# Patient Record
Sex: Female | Born: 1990 | Race: White | Hispanic: No | Marital: Single
Health system: Southern US, Community
[De-identification: ages and names within clinical notes are randomized; demographics above are authoritative.]

---

## 2012-02-24 ENCOUNTER — Emergency Department: Payer: Self-pay | Admitting: Otolaryngology

## 2012-05-19 ENCOUNTER — Ambulatory Visit: Payer: Self-pay | Admitting: Family Medicine

## 2013-09-18 ENCOUNTER — Ambulatory Visit: Payer: Self-pay | Admitting: Family Medicine

## 2014-01-17 IMAGING — CR DG ANKLE COMPLETE 3+V*L*
1 series · 5 of 5 positions shown · non-contrast
Comparison: none

REASON FOR EXAM: pain
COMMENTS:

PROCEDURE:     DXR - DXR ANKLE LEFT COMPLETE  - May 19, 2012  [DATE]
RESULT:     Comparison: None.

[Series 1: x ankle ap left · 0.14mm/px · 5 of 5 slices shown]
[im 1/5]
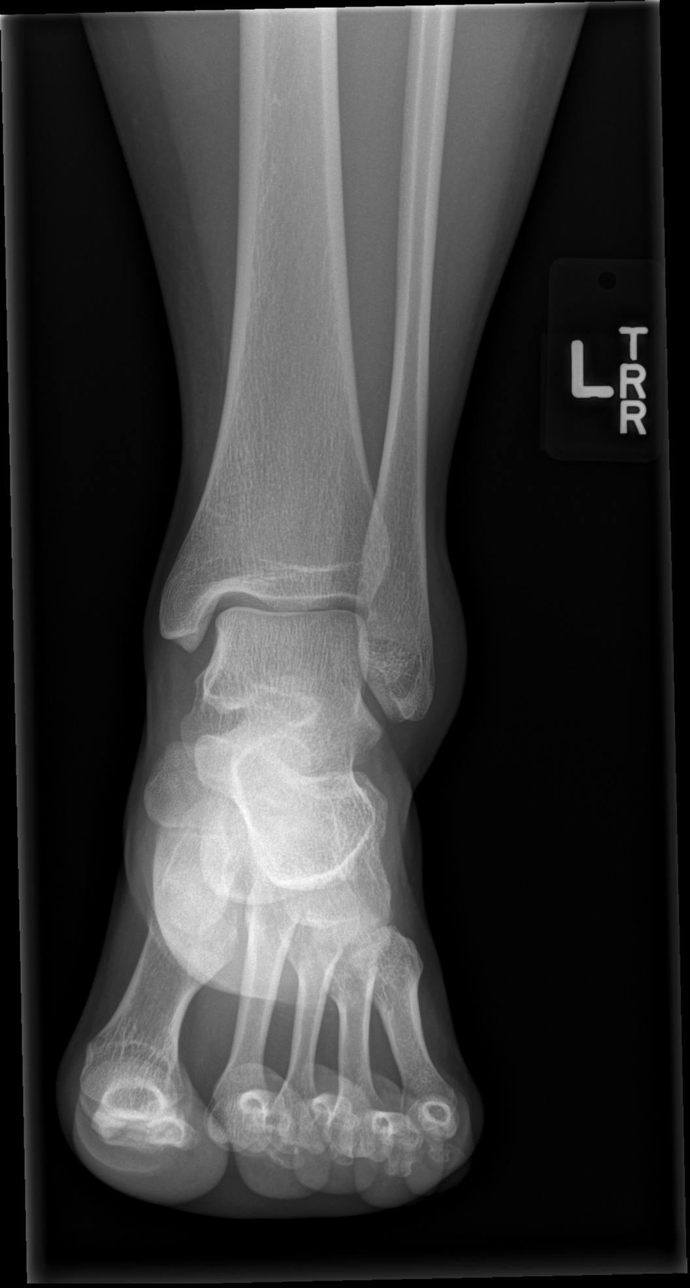
[im 2/5]
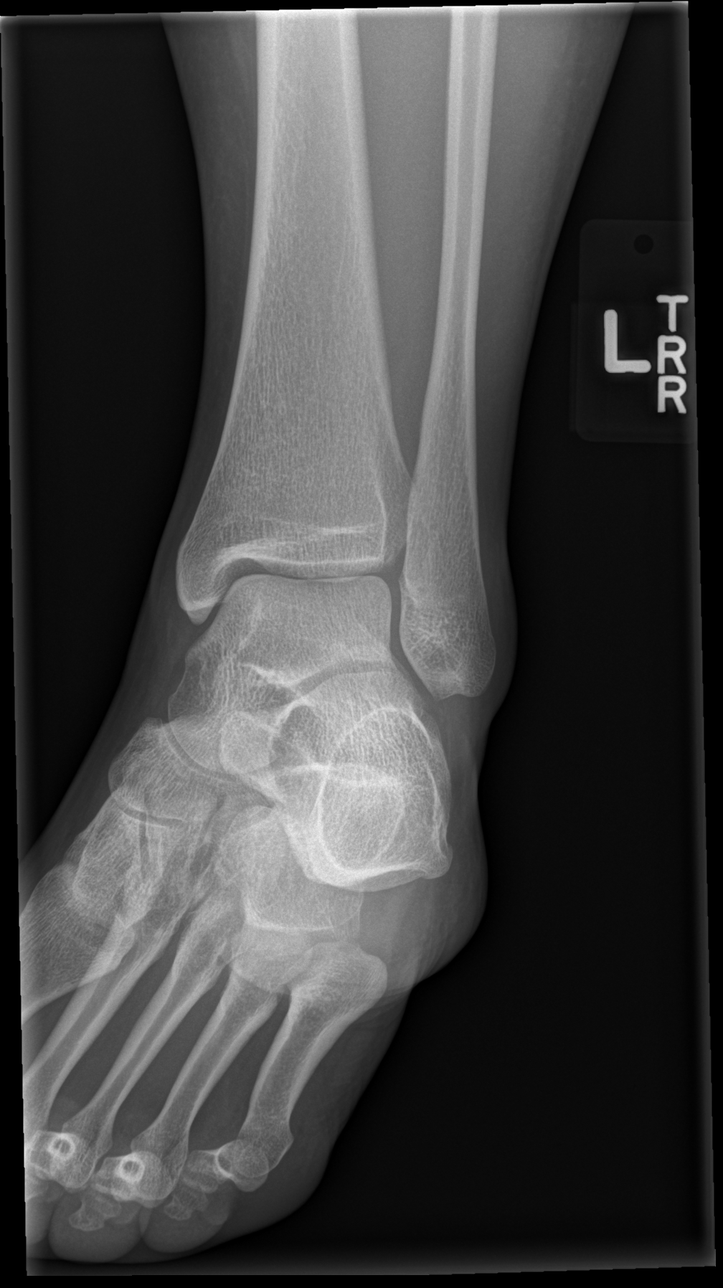
[im 3/5]
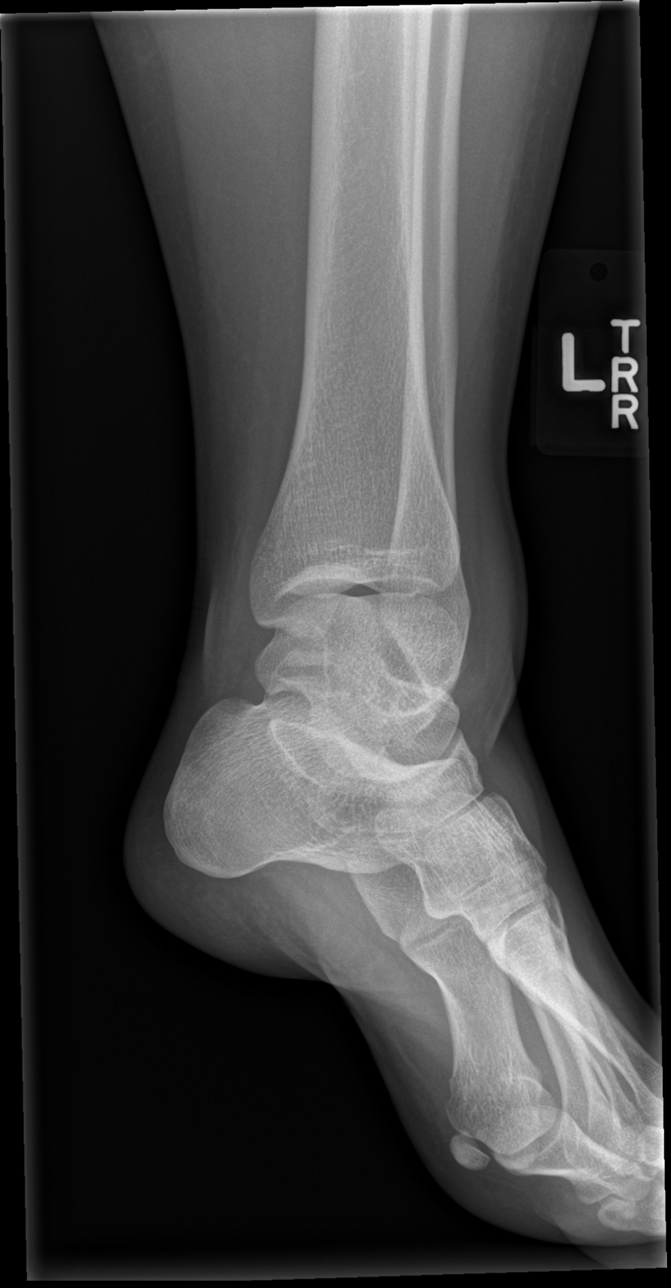
[im 4/5]
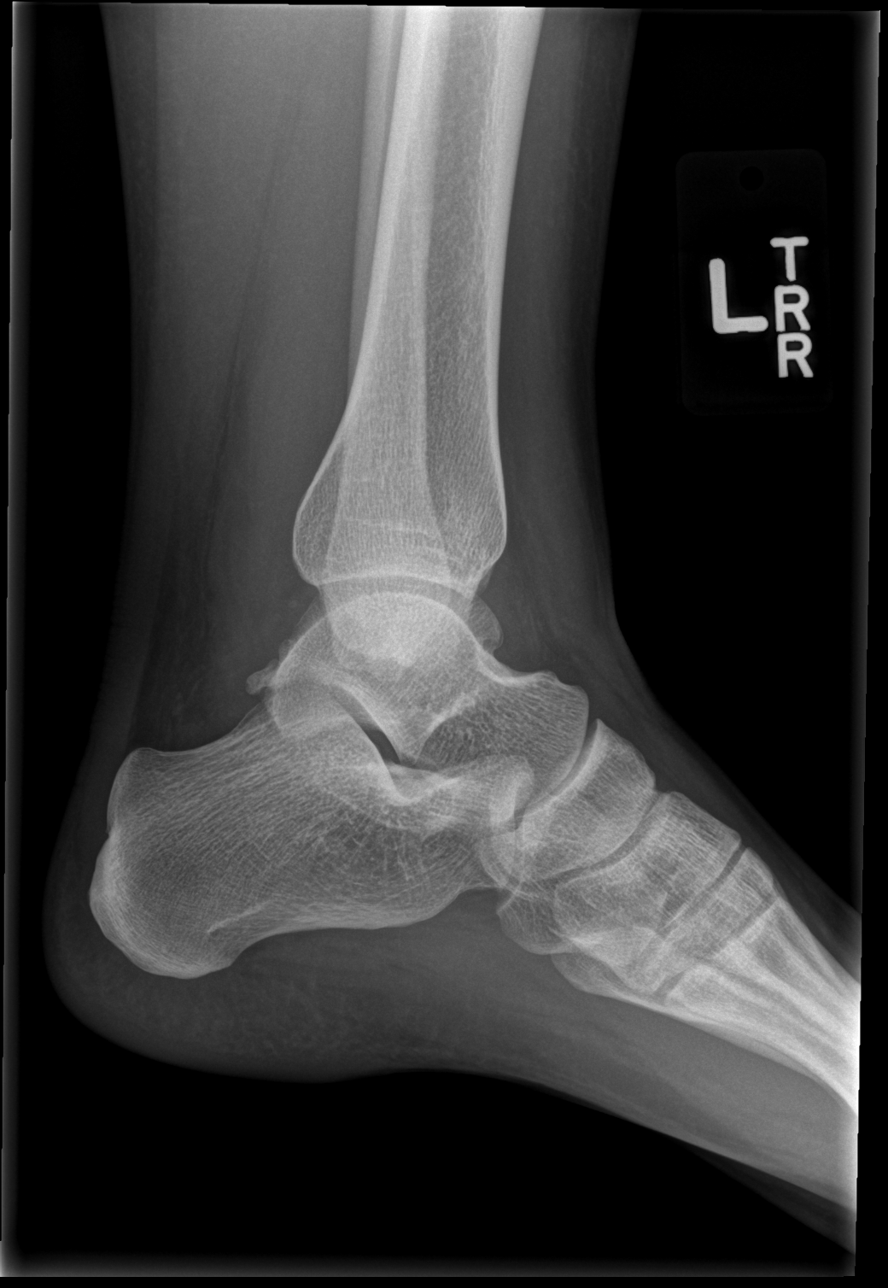
[im 5/5]
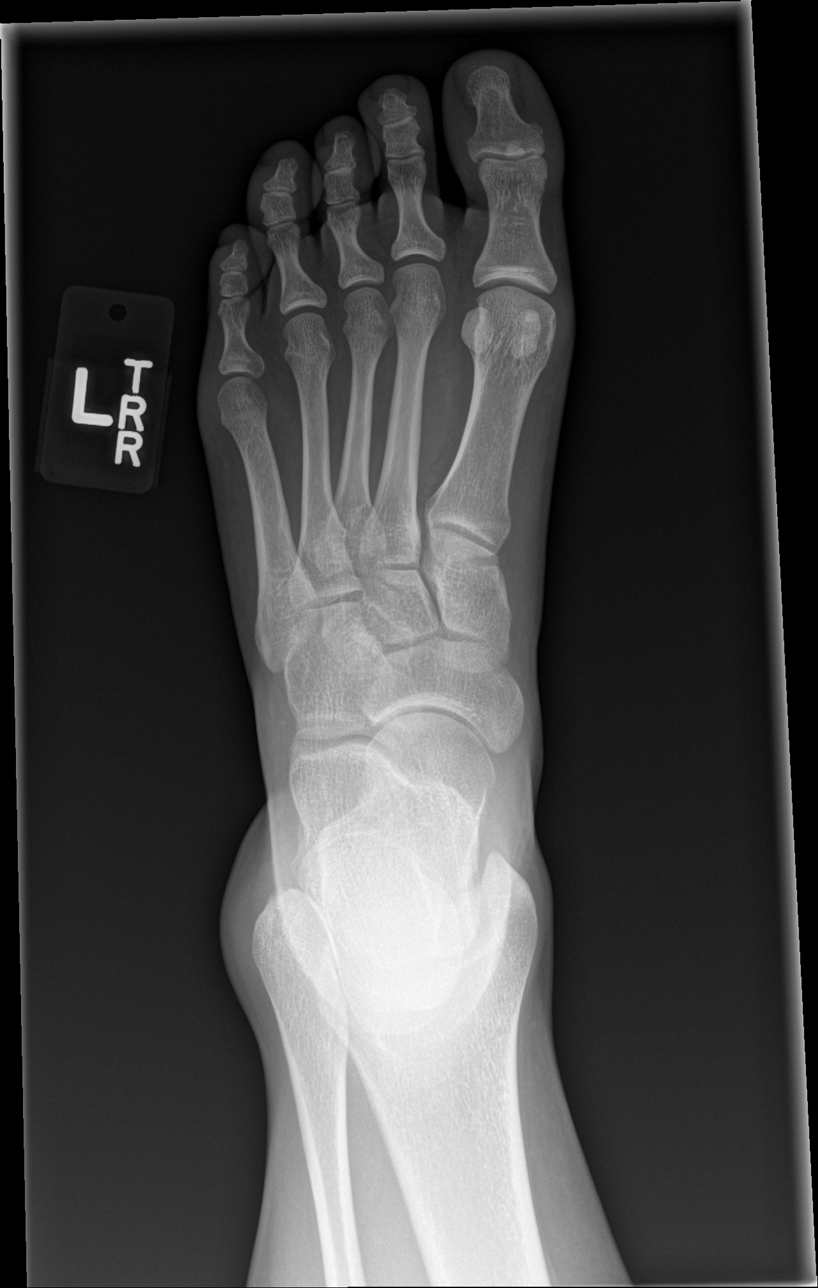

[5 of 5 positions shown; findings below may reference images not displayed]

FINDINGS: No acute fracture. There is soft tissue swelling adjacent to the lateral
malleolus.
IMPRESSION: No acute fracture.

[REDACTED]

## 2014-08-20 NOTE — Consult Note (Signed)
PATIENT NAME:  Savannah [name], Savannah [name] MR#:  [id number] DATE OF BIRTH:  Jul 11, 1990  DATE OF CONSULTATION:  02/24/2012  REFERRING PHYSICIAN:   CONSULTING PHYSICIAN:  Zackery BarefootJ. 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 There was no loss of consciousness. She did feel [name] little bit woozy when she came to the Emergency Room and was with her cheerleading coach who knows me and requested that I do the repair of the laceration.  On examination there is [name] stellate primarily T-shaped laceration above the left eyebrow that is approximately 2 cm in length in [name] horizontal direction 1 cm vertically. By palpation there is no bony injury. The pupils are equal, round, and reactive to light. Extraocular movements are intact.   PROCEDURE: Complex repair left brow complex laceration (total 3 cm).   DESCRIPTION OF PROCEDURE: The area was locally anesthetized with 1% lidocaine with epinephrine 1:100,000. It was irrigated with saline and cleaned with Betadine. Palpation with [name] gloved finger revealed no step-off or bony deformity. The wound was closed in multiple layers with 4-0 Vicryl on [name] P2 needle, 6-0 nylon and 7-0 nylon, and the wound was dressed with Polysporin. Ice pack was placed.   IMPRESSION: Left brow complex laceration. This has been repaired with [name] complex repair. There does not appear to be any concussion. Certainly if the patient experiences any neurologic changes, I have advised her to return to the Emergency Room. She is not to be involved in any contact sports including cheerleading practice until I see her back in the clinic at which time we will remove her sutures and discuss any sort of concussion-type symptoms. I have talked to her and her coach at  length about the second concussion syndrome. She will use Tylenol p.r.n. for pain. Wound care will be hydrogen peroxide and Polysporin b.i.d. She can start showering tomorrow. Ice pack p.r.n. tomorrow and until she sleeps tonight.  ____________________________ Shela CommonsJ. Gertie BaronMadison Nakyia Dau, MD [email or web address] D: 02/24/2012 23:55:28 ET T: 02/25/2012 09:34:01 ET JOB#: [id number] cc: Zackery BarefootJ. Madison Denny Lave, MD, <Dictator>, Glorious PeachSonya Thompson - [facility] Wendee CoppJMADISON Tamecia Mcdougald MD ELECTRONICALLY SIGNED 03/02/2012 10:49

## 2015-05-19 IMAGING — CR LEFT WRIST - COMPLETE 3+ VIEW
1 series · 4 of 4 positions shown · non-contrast
Comparison: None.

CLINICAL DATA: 22-year-old female with left wrist pain.

EXAM:
LEFT WRIST - COMPLETE 3+ VIEW

[Series 1: pa · 0.17mm/px · 4 of 4 slices shown]
[im 1/4]
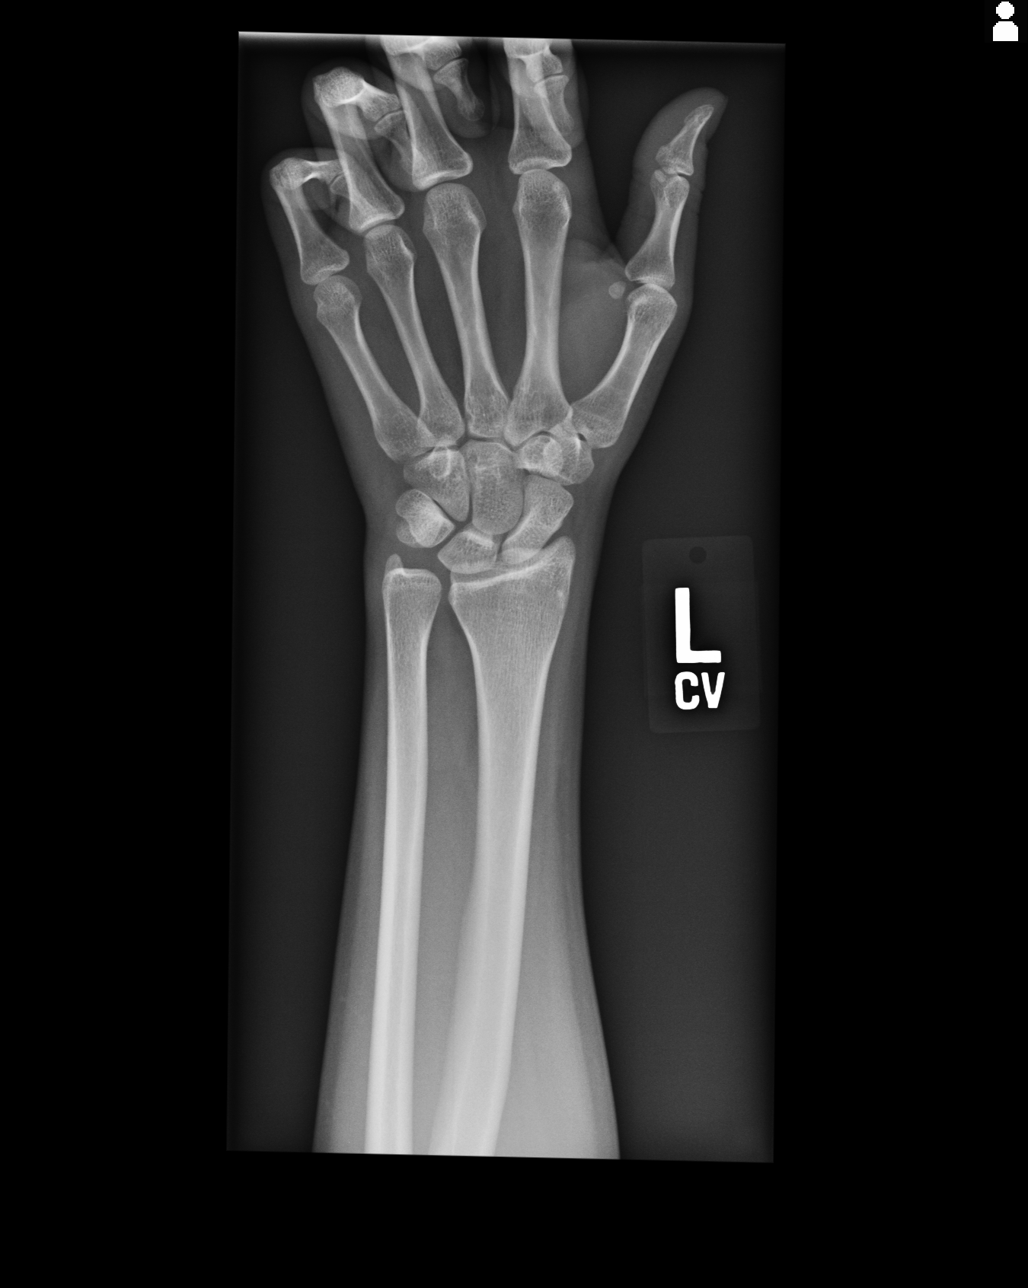
[im 2/4]
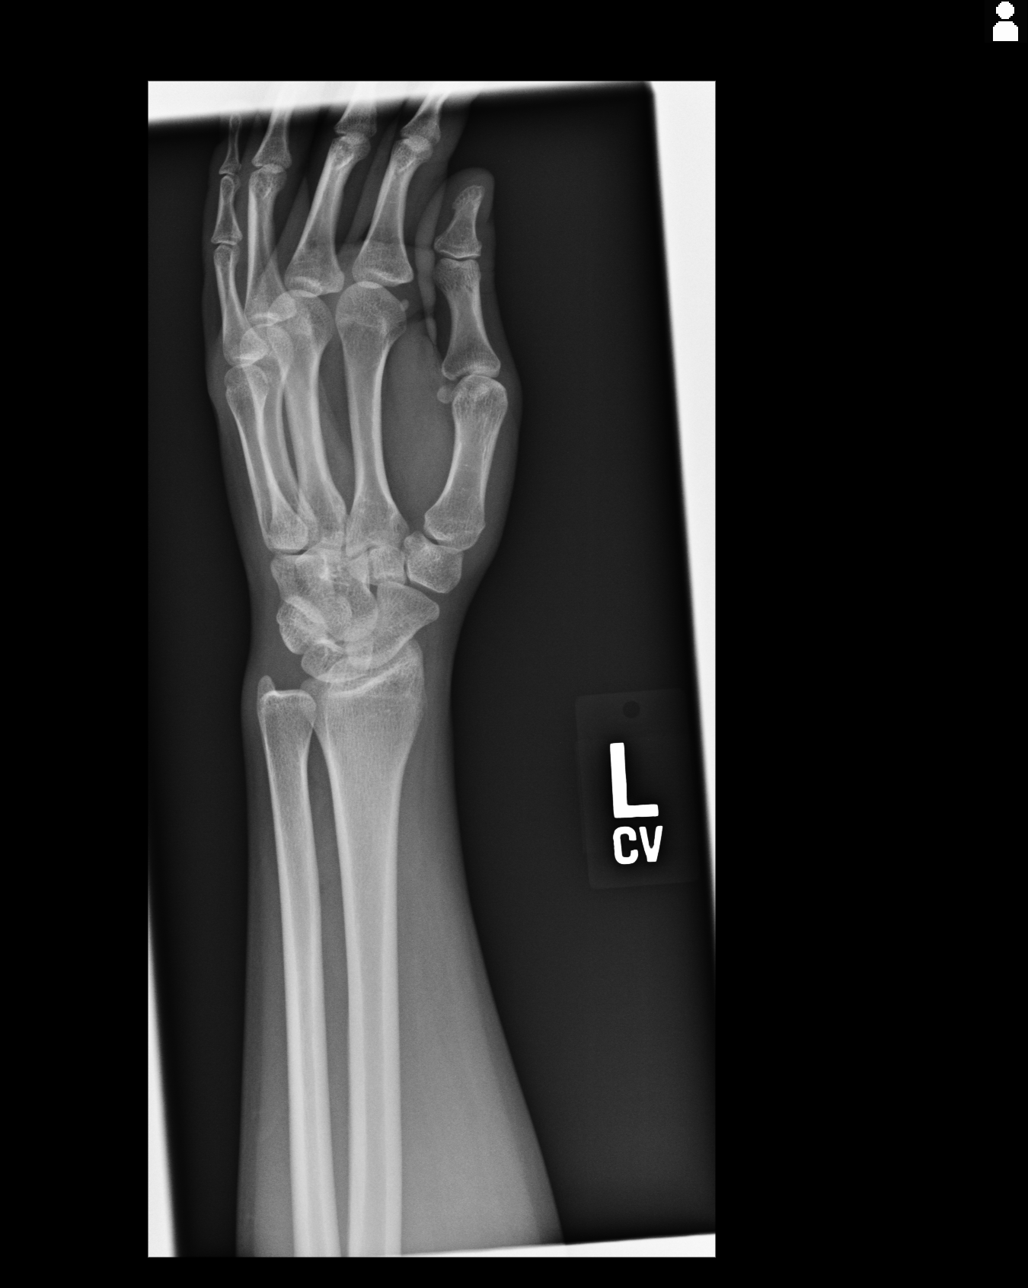
[im 3/4]
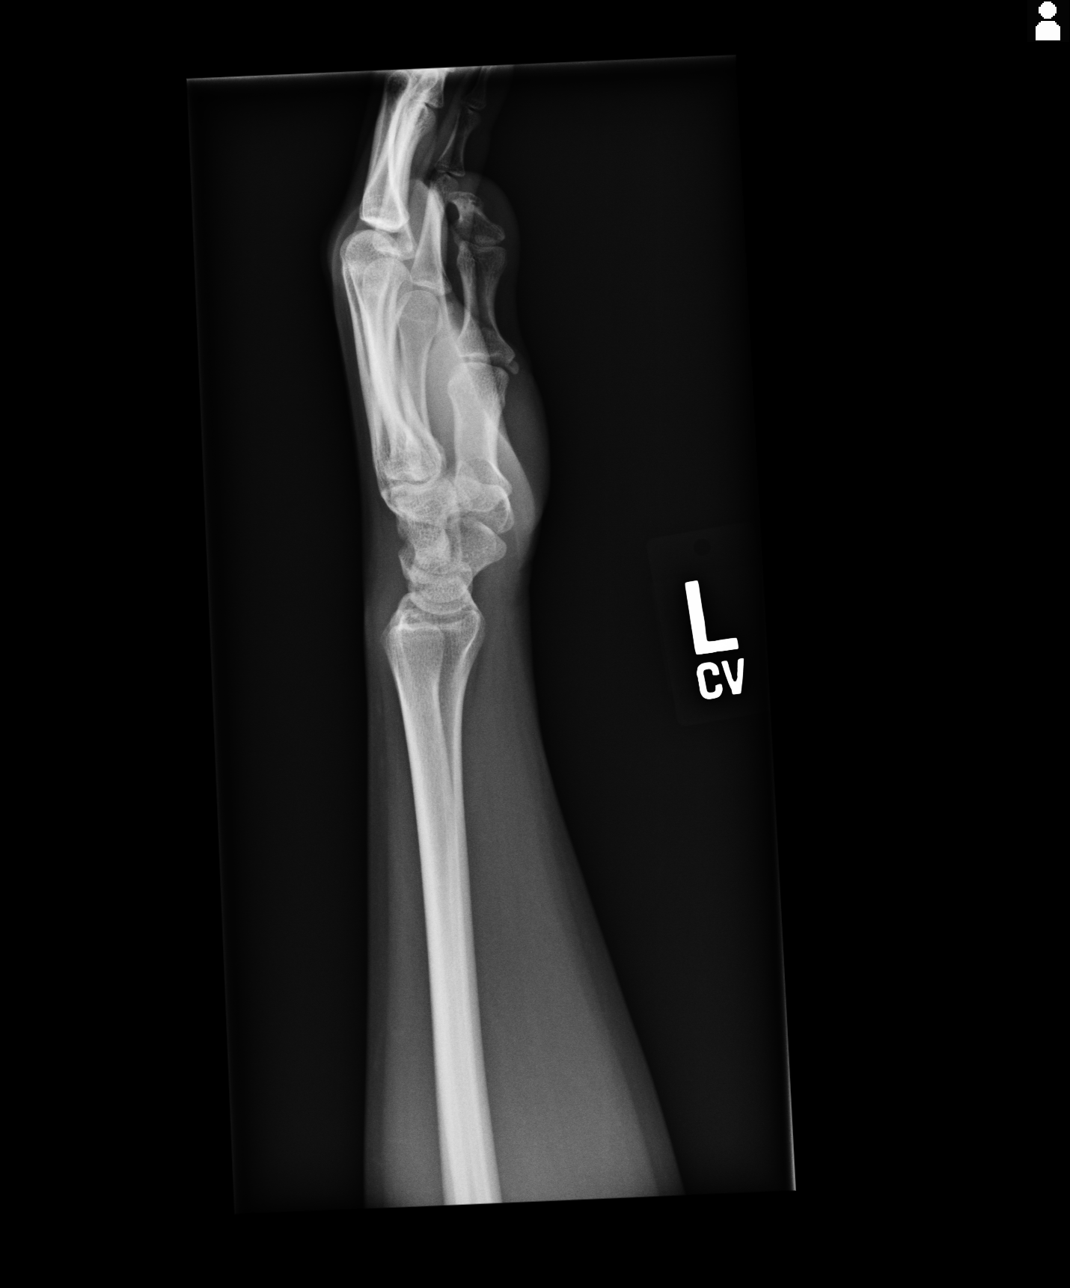
[im 4/4]
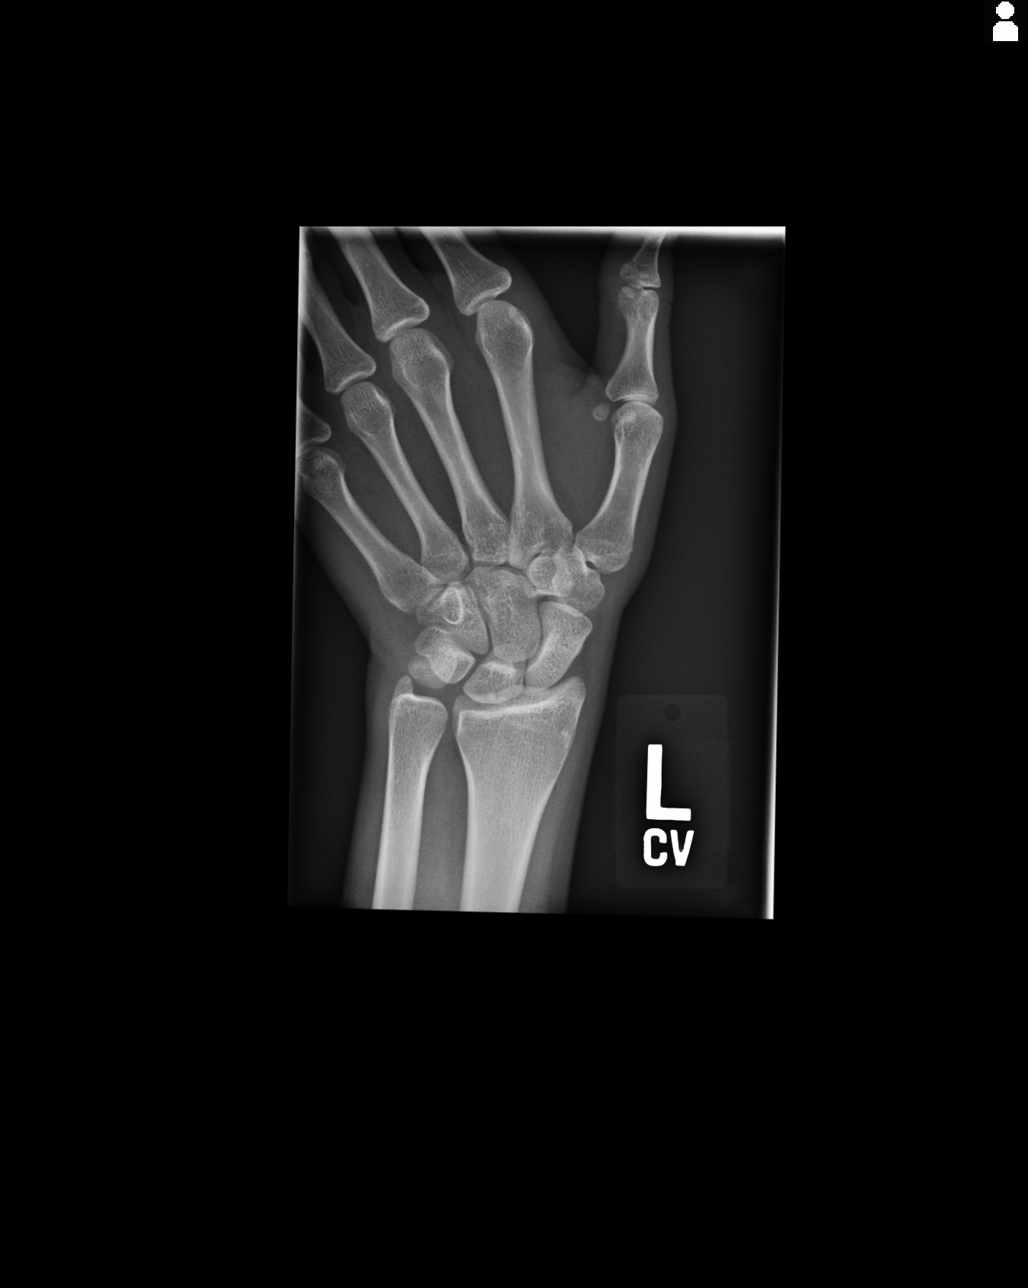

[4 of 4 positions shown; findings below may reference images not displayed]

FINDINGS: There is no evidence of fracture or dislocation. There is no
evidence of arthropathy or other focal bone abnormality. Soft
tissues are unremarkable.
IMPRESSION: Negative.
